# Patient Record
Sex: Male | Born: 1985 | Race: White | Hispanic: No | Marital: Single | Smoking: Current every day smoker
Health system: Southern US, Community
[De-identification: ages and names within clinical notes are randomized; demographics above are authoritative.]

---
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
## 2007-02-20 ENCOUNTER — Emergency Department (HOSPITAL_COMMUNITY): Admission: EM | Admit: 2007-02-20 | Discharge: 2007-02-20 | Payer: Self-pay | Admitting: Emergency Medicine

## 2007-11-13 ENCOUNTER — Emergency Department (HOSPITAL_COMMUNITY): Admission: EM | Admit: 2007-11-13 | Discharge: 2007-11-13 | Payer: Self-pay | Admitting: Emergency Medicine

## 2008-12-26 ENCOUNTER — Emergency Department (HOSPITAL_COMMUNITY): Admission: EM | Admit: 2008-12-26 | Discharge: 2008-12-26 | Payer: Self-pay | Admitting: *Deleted

## 2010-11-02 LAB — POCT CARDIAC MARKERS
CKMB, poc: <1 ng/mL — ABNORMAL LOW (ref 1.0–8.0)
Myoglobin, poc: 44 ng/mL (ref 12–200)
Myoglobin, poc: 58.1 ng/mL (ref 12–200)

## 2010-11-02 LAB — POCT I-STAT, CHEM 8
Creatinine, Ser: 1.1 mg/dL (ref 0.4–1.5)
Hemoglobin: 15 g/dL (ref 13.0–17.0)
Potassium: 3.6 mEq/L (ref 3.5–5.1)
Sodium: 142 mEq/L (ref 135–145)

## 2011-02-15 ENCOUNTER — Emergency Department (HOSPITAL_COMMUNITY): Payer: Self-pay

## 2011-02-15 ENCOUNTER — Emergency Department (HOSPITAL_COMMUNITY)
Admission: EM | Admit: 2011-02-15 | Discharge: 2011-02-15 | Disposition: A | Discharge status: Home / Self Care | Payer: Self-pay | Attending: Emergency Medicine | Admitting: Emergency Medicine

## 2011-02-15 DIAGNOSIS — M549 Dorsalgia, unspecified: Secondary | ICD-10-CM | POA: Insufficient documentation

## 2011-02-15 DIAGNOSIS — T07XXXA Unspecified multiple injuries, initial encounter: Secondary | ICD-10-CM | POA: Insufficient documentation

## 2011-02-15 DIAGNOSIS — S01119A Laceration without foreign body of unspecified eyelid and periocular area, initial encounter: Secondary | ICD-10-CM | POA: Insufficient documentation

## 2011-02-15 DIAGNOSIS — Y92009 Unspecified place in unspecified non-institutional (private) residence as the place of occurrence of the external cause: Secondary | ICD-10-CM | POA: Insufficient documentation

## 2011-02-15 DIAGNOSIS — R079 Chest pain, unspecified: Secondary | ICD-10-CM | POA: Insufficient documentation

## 2011-02-15 DIAGNOSIS — F17200 Nicotine dependence, unspecified, uncomplicated: Secondary | ICD-10-CM | POA: Insufficient documentation

## 2011-02-15 DIAGNOSIS — M79609 Pain in unspecified limb: Secondary | ICD-10-CM | POA: Insufficient documentation

## 2011-05-10 LAB — COMPREHENSIVE METABOLIC PANEL
ALT: 27
AST: 20
Albumin: 4.4
Calcium: 9.6
Creatinine, Ser: 1.03
GFR calc Af Amer: >60
GFR calc non Af Amer: >60
Sodium: 140
Total Protein: 7.7

## 2011-05-10 LAB — CBC
MCHC: 35
MCV: 85
Platelets: 320
RBC: 5.2
RDW: 13.4

## 2011-05-10 LAB — RAPID URINE DRUG SCREEN, HOSP PERFORMED
Amphetamines: NOT DETECTED
Barbiturates: NOT DETECTED
Benzodiazepines: NOT DETECTED
Cocaine: NOT DETECTED
Opiates: NOT DETECTED
Tetrahydrocannabinol: NOT DETECTED

## 2011-05-10 LAB — DIFFERENTIAL
Eosinophils Absolute: 0.1
Eosinophils Relative: 1
Lymphocytes Relative: 26
Lymphs Abs: 2.5
Monocytes Relative: 10

## 2012-05-17 ENCOUNTER — Encounter (HOSPITAL_COMMUNITY): Payer: Self-pay | Admitting: Physical Medicine and Rehabilitation

## 2012-05-17 ENCOUNTER — Emergency Department (HOSPITAL_COMMUNITY)
Admission: EM | Admit: 2012-05-17 | Discharge: 2012-05-17 | Disposition: A | Discharge status: Home / Self Care | Payer: Self-pay | Attending: Emergency Medicine | Admitting: Emergency Medicine

## 2012-05-17 DIAGNOSIS — K089 Disorder of teeth and supporting structures, unspecified: Secondary | ICD-10-CM | POA: Insufficient documentation

## 2012-05-17 DIAGNOSIS — Z79899 Other long term (current) drug therapy: Secondary | ICD-10-CM | POA: Insufficient documentation

## 2012-05-17 DIAGNOSIS — K0889 Other specified disorders of teeth and supporting structures: Secondary | ICD-10-CM

## 2012-05-17 DIAGNOSIS — F17200 Nicotine dependence, unspecified, uncomplicated: Secondary | ICD-10-CM | POA: Insufficient documentation

## 2012-05-17 MED ORDER — NAPROXEN 500 MG PO TABS: 500.0000 mg | ORAL_TABLET | Freq: Two times a day (BID) | ORAL | Status: AC | PRN

## 2012-05-17 MED ORDER — IBUPROFEN 400 MG PO TABS
600.0000 mg | ORAL_TABLET | Freq: Once | ORAL | Status: AC
  Administered 2012-05-17: 600 mg via ORAL
  Filled 2012-05-17: qty 1

## 2012-05-17 MED ORDER — PENICILLIN V POTASSIUM 500 MG PO TABS: 500.0000 mg | ORAL_TABLET | Freq: Four times a day (QID) | ORAL | Status: AC

## 2012-05-17 MED ORDER — OXYCODONE-ACETAMINOPHEN 5-325 MG PO TABS: 1.0000 | ORAL_TABLET | ORAL | Status: AC | PRN

## 2012-05-17 NOTE — ED Notes (Signed)
Pt presents to department for evaluation of L lower molar pain. Ongoing x2 days. Pt states "this could be my wisdom tooth." 5/10 pain upon arrival to ED. He is conscious alert and oriented x4.

## 2012-05-17 NOTE — ED Notes (Signed)
Signature pad not working in room, pt verbalized understanding of discharge instructions.

## 2012-05-17 NOTE — ED Provider Notes (Signed)
History    26yM with toothache. L lower molar. Gradual onset about 2d ago. Constant. Worse when trying to eat or drink. No difficulty breathing or swallowing. No fever or chills. Has not seen dentist for this. Has tried tylenol and oragel with minimal relief.  CSN: [id number]  Arrival date & time 05/17/12  1538   First MD Initiated Contact with Patient 05/17/12 1627      Chief Complaint  Patient presents with  . Dental Pain    (Consider location/radiation/quality/duration/timing/severity/associated sxs/prior treatment) HPI  No past medical history on file.  No past surgical history on file.  History reviewed. No pertinent family history.  History  Substance Use Topics  . Smoking status: Current Every Day Smoker  . Smokeless tobacco: Not on file  . Alcohol Use: No      Review of Systems   Review of symptoms negative unless otherwise noted in HPI.   Allergies  Review of patient's allergies indicates no known allergies.  Home Medications   Current Outpatient Rx  Name Route Sig Dispense Refill  . ACETAMINOPHEN 325 MG PO TABS Oral Take 650 mg by mouth every 6 (six) hours as needed. For pain    . NAPROXEN 500 MG PO TABS Oral Take 1 tablet (500 mg total) by mouth 2 (two) times daily as needed. 20 tablet 0  . OXYCODONE-ACETAMINOPHEN 5-325 MG PO TABS Oral Take 1-2 tablets by mouth every 4 (four) hours as needed for pain. 15 tablet 0  . PENICILLIN V POTASSIUM 500 MG PO TABS Oral Take 1 tablet (500 mg total) by mouth 4 (four) times daily. 28 tablet 0    BP 122/92  Pulse 122  Temp 98.2 F (36.8 C) (Oral)  Resp 22  SpO2 96%  Physical Exam  Nursing note and vitals reviewed. Constitutional: He appears well-developed and well-nourished. No distress.  HENT:  Head: Normocephalic and atraumatic.       L lower 2nd molar cracked. No drainable collection. Posterior pharynx clear. Uvula midline. Handling secretions. Submental tissues soft. Neck supple. No nodes. No stridor.    Eyes: Conjunctivae normal are normal. Right eye exhibits no discharge. Left eye exhibits no discharge.  Neck: Neck supple.  Cardiovascular: Normal rate, regular rhythm and normal heart sounds.  Exam reveals no gallop and no friction rub.   No murmur heard. Pulmonary/Chest: Effort normal and breath sounds normal. No respiratory distress.  Abdominal: Soft. He exhibits no distension. There is no tenderness.  Musculoskeletal: He exhibits no edema and no tenderness.  Neurological: He is alert.  Skin: Skin is warm and dry.  Psychiatric: He has a normal mood and affect. His behavior is normal. Thought content normal.    ED Course  NERVE BLOCK Date/Time: 05/17/2012 4:40 PM Performed by: Raeford Razor Authorized by: Raeford Razor Consent: Verbal consent obtained. Risks and benefits: risks, benefits and alternatives were discussed Patient identity confirmed: verbally with patient, provided demographic data and arm band Indications: pain relief Body area: face/mouth Nerve: inferior alveolar Laterality: left Patient sedated: no Patient position: sitting Needle gauge: 27 G Location technique: anatomical landmarks Local anesthetic: lidocaine 2% with epinephrine Anesthetic total: 1.5 ml Outcome: pain improved Patient tolerance: Patient tolerated the procedure well with no immediate complications.   (including critical care time)  Labs Reviewed - No data to display No results found.   1. Pain, dental       MDM  26yM with dental pain. Cracked molar. No evidence of deep space head/neck infection. Not tachy on my exam  as noted in triage vitals. Possibly from pain/anxiety? Non-toxic. Dental block for pain relief. PRN pain meds. Dental resources provided. Return precautions dicussed.        Raeford Razor, MD 05/17/12 [phone]

## 2013-01-21 IMAGING — CT CT CERVICAL SPINE W/O CM
4 of 5 series · 15 of 33 positions shown, 17 images · non-contrast
Comparison: None.

CLINICAL DATA: Pain status post trauma.

CT CERVICAL SPINE WITHOUT CONTRAST,CT HEAD WITHOUT CONTRAST
TECHNIQUE: Multidetector CT imaging of the cervical spine was
performed. Multiplanar CT image reconstructions were also
generated.,Technique:  Contiguous axial images were obtained from
the base of the skull through the vertex without contrast.

[Series 6: c_spine 2.0 b31s detail · axial · 0.28mm/px · z∈[+965,+1091]mm · 4 of 106 slices shown, 5 images]
[im 22/106  soft-tissue]
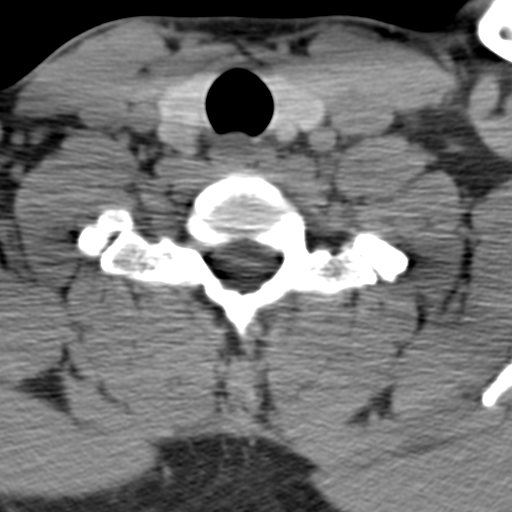
[im 22/106  bone]
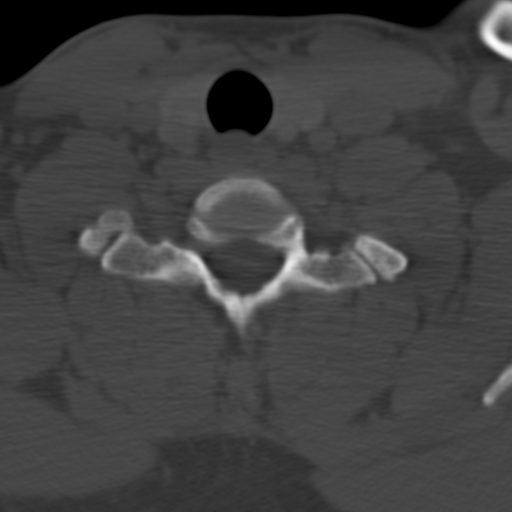
[im 43/106  bone]
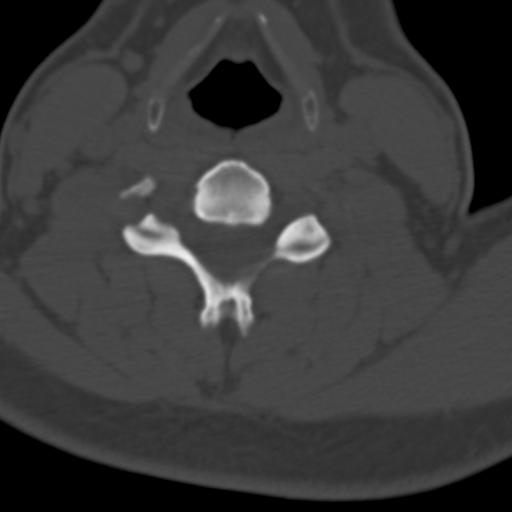
[im 64/106  bone]
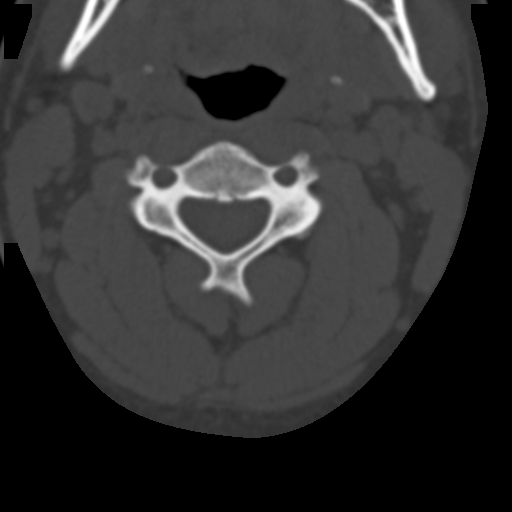
[im 85/106  bone]
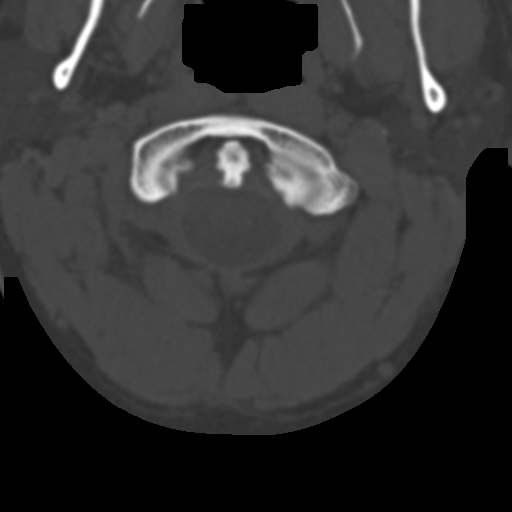

[Series 602: axial · axial · 0.41mm/px · z∈[+960,+1049]mm · 3 of 91 slices shown]
[im 23/91  bone]
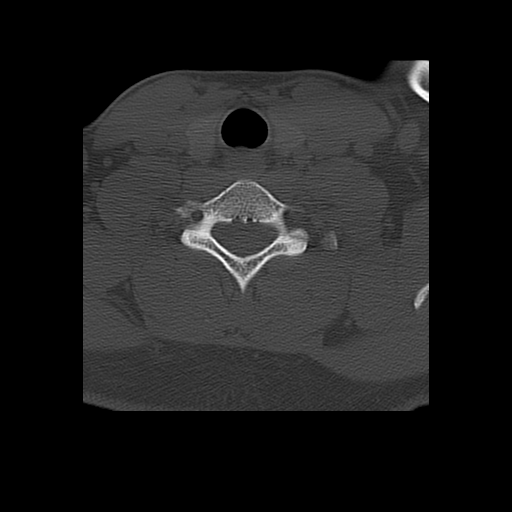
[im 46/91  bone]
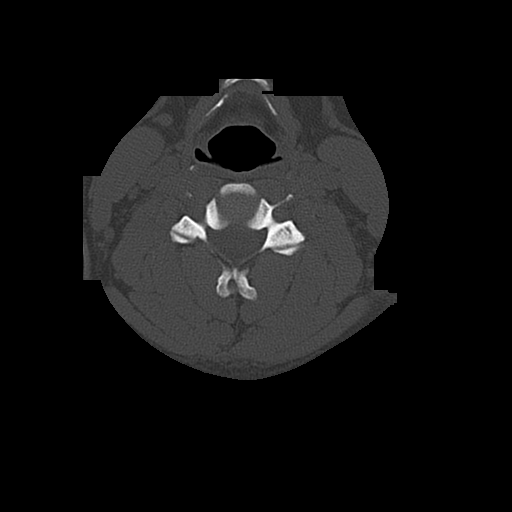
[im 68/91  bone]
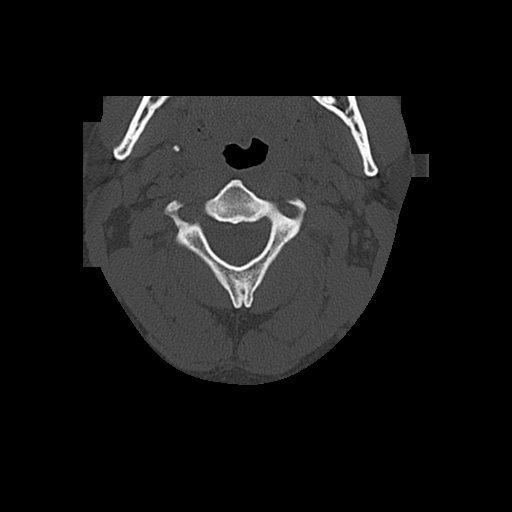

[Series 603: cor · coronal · 0.41mm/px · 3 of 55 slices shown]
[im 11/55  bone]
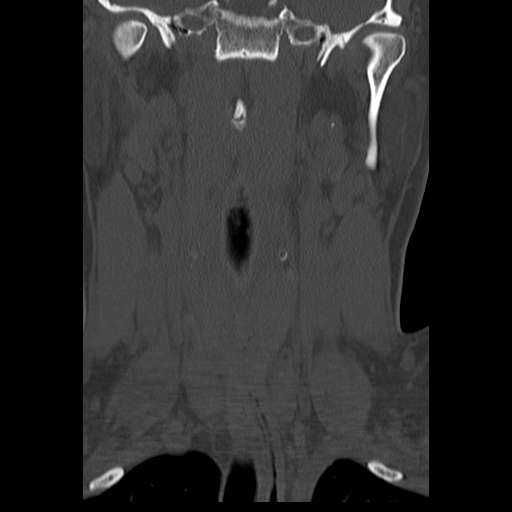
[im 22/55  bone]
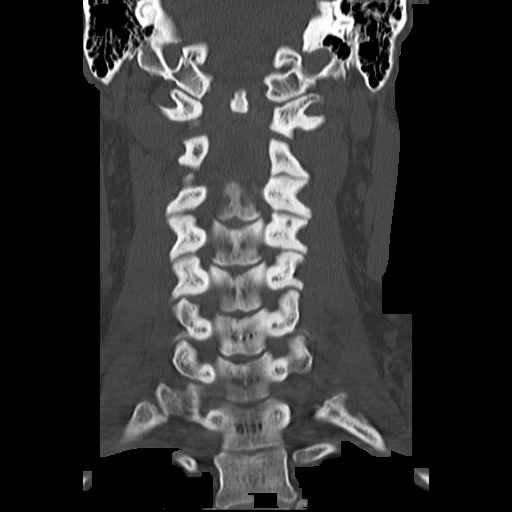
[im 33/55  bone]
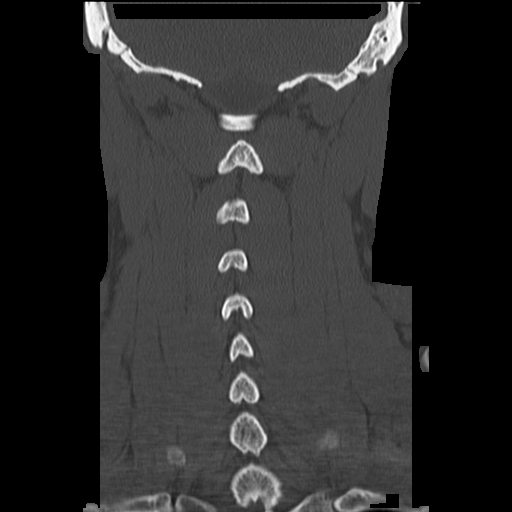

[Series 604: sag · sagittal · 0.41mm/px · 5 of 51 slices shown, 6 images]
[im 17/51  bone]
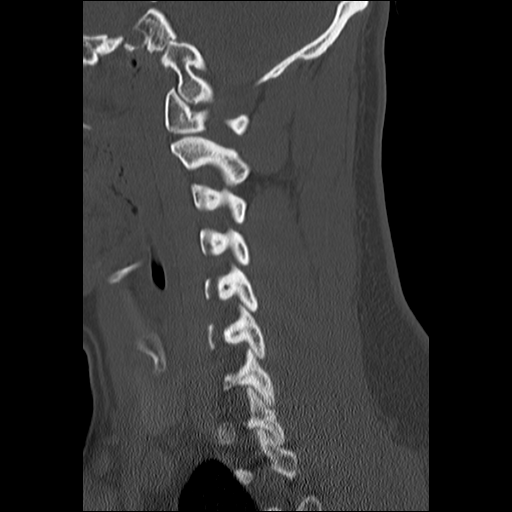
[im 21/51  bone]
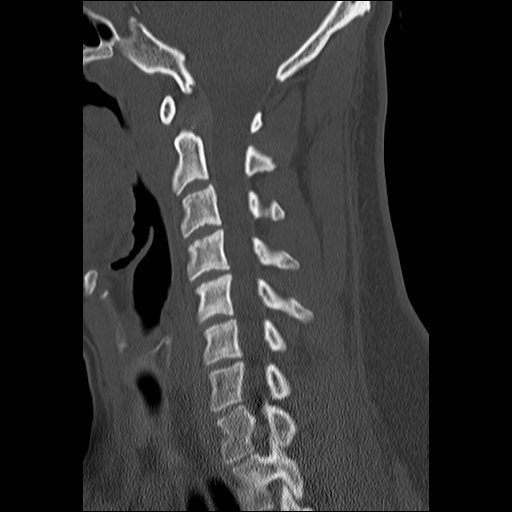
[im 26/51  soft-tissue]
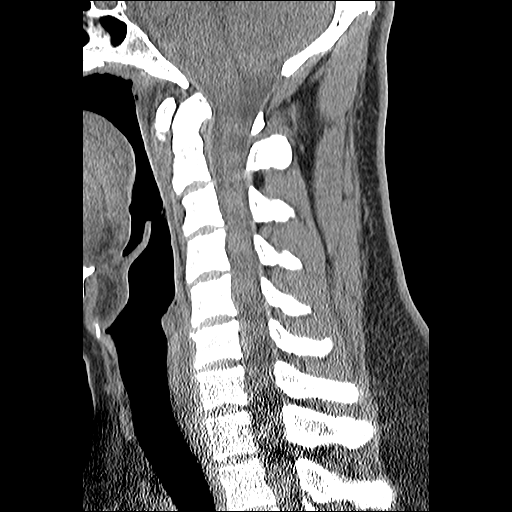
[im 26/51  bone]
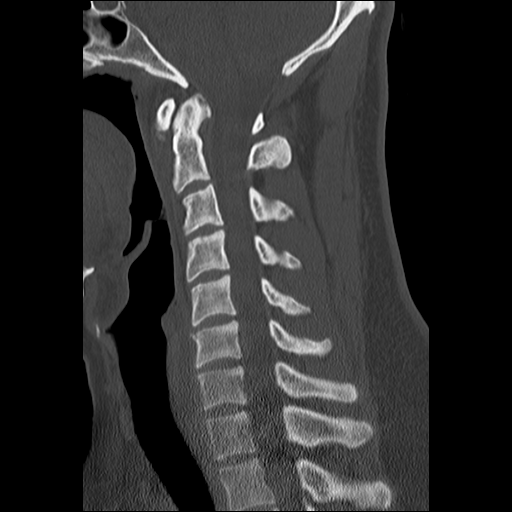
[im 30/51  bone]
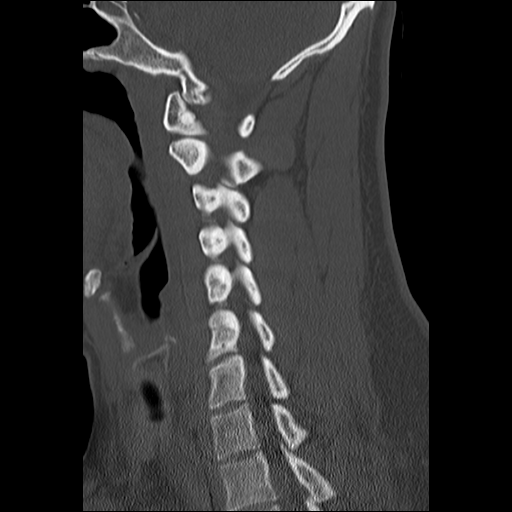
[im 34/51  bone]
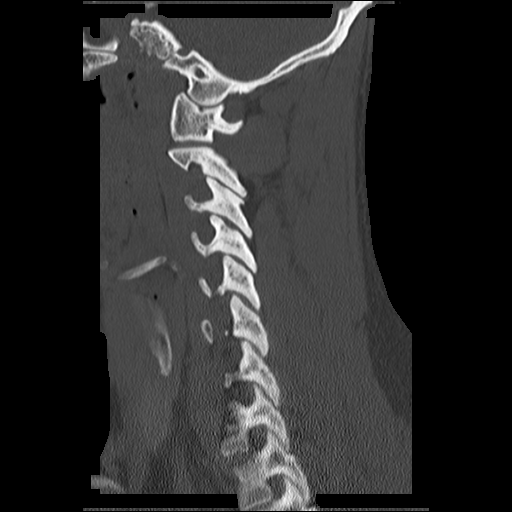

[15 of 33 positions shown; findings below may reference images not displayed]

FINDINGS: Brain:  There is no evidence for acute hemorrhage, hydrocephalus,
mass lesion, or abnormal extra-axial fluid collection.  No definite
CT evidence for acute infarction.  The visualized paranasal sinuses
and mastoid air cells are predominately clear.,

Cervical spine: Loss of normal cervical lordosis is likely
positional or secondary to muscle spasm.  Vertebral body heights
are maintained as are the intervertebral disc spaces.  No acute
fracture or dislocation identified.  There is a well corticated
osseous density along the inferior margin of the anterior arch of
C1.  The craniocervical relationship is maintained.

Prevertebral soft tissues and paravertebral soft tissues are within
normal limits.  The lung apices are clear.
IMPRESSION: No acute intracranial abnormality.

Loss of normal cervical lordosis is likely positional or secondary
to muscle spasm.  No acute fracture or dislocation identified.

## 2013-11-12 ENCOUNTER — Emergency Department (HOSPITAL_COMMUNITY)
Admission: EM | Admit: 2013-11-12 | Discharge: 2013-11-12 | Disposition: A | Discharge status: Home / Self Care | Payer: Self-pay | Attending: Emergency Medicine | Admitting: Emergency Medicine

## 2013-11-12 ENCOUNTER — Encounter (HOSPITAL_COMMUNITY): Payer: Self-pay | Admitting: Emergency Medicine

## 2013-11-12 DIAGNOSIS — H6120 Impacted cerumen, unspecified ear: Secondary | ICD-10-CM | POA: Insufficient documentation

## 2013-11-12 DIAGNOSIS — H6090 Unspecified otitis externa, unspecified ear: Secondary | ICD-10-CM

## 2013-11-12 DIAGNOSIS — H60399 Other infective otitis externa, unspecified ear: Secondary | ICD-10-CM | POA: Insufficient documentation

## 2013-11-12 DIAGNOSIS — F17200 Nicotine dependence, unspecified, uncomplicated: Secondary | ICD-10-CM | POA: Insufficient documentation

## 2013-11-12 MED ORDER — CARBAMIDE PEROXIDE 6.5 % OT SOLN: 5.0000 [drp] | Freq: Two times a day (BID) | OTIC | Status: AC | PRN

## 2013-11-12 MED ORDER — NEOMYCIN-POLYMYXIN-HC 3.5-10000-1 OT SUSP: 4.0000 [drp] | Freq: Four times a day (QID) | OTIC | Status: AC

## 2013-11-12 NOTE — ED Notes (Signed)
Pt cannot hear out of left ear since 7pm yesterday.  No pain

## 2013-11-12 NOTE — Discharge Instructions (Signed)
Read the information below.  Use the prescribed medication as directed.  Please discuss all new medications with your pharmacist.  You may return to the Emergency Department at any time for worsening condition or any new symptoms that concern you.  If you develop fevers, uncontrolled pain, discharge or excessive bleeding from your ear, return to the ER for a recheck.     Cerumen Impaction A cerumen impaction is when the wax in your ear forms a plug. This plug usually causes reduced hearing. Sometimes it also causes an earache or dizziness. Removing a cerumen impaction can be difficult and painful. The wax sticks to the ear canal. The canal is sensitive and bleeds easily. If you try to remove a heavy wax buildup with a cotton tipped swab, you may push it in further. Irrigation with water, suction, and small ear curettes may be used to clear out the wax. If the impaction is fixed to the skin in the ear canal, ear drops may be needed for a few days to loosen the wax. People who build up a lot of wax frequently can use ear wax removal products available in your local drugstore. SEEK MEDICAL CARE IF:  You develop an earache, increased hearing loss, or marked dizziness. Document Released: 08/19/2004 Document Revised: 10/04/2011 Document Reviewed: 10/09/2009 [facility] Patient Information 2014 [place], [place].  Otitis Externa Otitis externa is a bacterial or fungal infection of the outer ear canal. This is the area from the eardrum to the outside of the ear. Otitis externa is sometimes called "swimmer's ear." CAUSES  Possible causes of infection include:  Swimming in dirty water.  Moisture remaining in the ear after swimming or bathing.  Mild injury (trauma) to the ear.  Objects stuck in the ear (foreign body).  Cuts or scrapes (abrasions) on the outside of the ear. SYMPTOMS  The first symptom of infection is often itching in the ear canal. Later signs and symptoms may include swelling and redness  of the ear canal, ear pain, and yellowish-white fluid (pus) coming from the ear. The ear pain may be worse when pulling on the earlobe. DIAGNOSIS  Your caregiver will perform a physical exam. A sample of fluid may be taken from the ear and examined for bacteria or fungi. TREATMENT  Antibiotic ear drops are often given for 10 to 14 days. Treatment may also include pain medicine or corticosteroids to reduce itching and swelling. PREVENTION   Keep your ear dry. Use the corner of a towel to absorb water out of the ear canal after swimming or bathing.  Avoid scratching or putting objects inside your ear. This can damage the ear canal or remove the protective wax that lines the canal. This makes it easier for bacteria and fungi to grow.  Avoid swimming in lakes, polluted water, or poorly chlorinated pools.  You may use ear drops made of rubbing alcohol and vinegar after swimming. Combine equal parts of white vinegar and alcohol in a bottle. Put 3 or 4 drops into each ear after swimming. HOME CARE INSTRUCTIONS   Apply antibiotic ear drops to the ear canal as prescribed by your caregiver.  Only take over-the-counter or prescription medicines for pain, discomfort, or fever as directed by your caregiver.  If you have diabetes, follow any additional treatment instructions from your caregiver.  Keep all follow-up appointments as directed by your caregiver. SEEK MEDICAL CARE IF:   You have a fever.  Your ear is still red, swollen, painful, or draining pus after 3 days.  Your redness, swelling, or pain gets worse.  You have a severe headache.  You have redness, swelling, pain, or tenderness in the area behind your ear. MAKE SURE YOU:   Understand these instructions.  Will watch your condition.  Will get help right away if you are not doing well or get worse. Document Released: 07/12/2005 Document Revised: 10/04/2011 Document Reviewed: 07/29/2011 [facility] Patient Information 2014  [place], [place].   Emergency Department Resource Guide 1) Find a Doctor and Pay Out of Pocket Although you won't have to find out who is covered by your insurance plan, it is a good idea to ask around and get recommendations. You will then need to call the office and see if the doctor you have chosen will accept you as a new patient and what types of options they offer for patients who are self-pay. Some doctors offer discounts or will set up payment plans for their patients who do not have insurance, but you will need to ask so you aren't surprised when you get to your appointment.  2) Contact Your Local Health Department Not all health departments have doctors that can see patients for sick visits, but many do, so it is worth a call to see if yours does. If you don't know where your local health department is, you can check in your phone book. The CDC also has a tool to help you locate your state's health department, and many state websites also have listings of all of their local health departments.  3) Find a Walk-in Clinic If your illness is not likely to be very severe or complicated, you may want to try a walk in clinic. These are popping up all over the country in pharmacies, drugstores, and shopping centers. They're usually staffed by nurse practitioners or physician assistants that have been trained to treat common illnesses and complaints. They're usually fairly quick and inexpensive. However, if you have serious medical issues or chronic medical problems, these are probably not your best option.  No Primary Care Doctor: - Call Health Connect at  [phone] - they can help you locate a primary care doctor that  accepts your insurance, provides certain services, etc. - Physician Referral Service- [phone]  Chronic Pain Problems: Organization         Address  Phone   Notes  [facility]  [phone] Patients need to be referred by their primary care doctor.    Medication Assistance: Organization         Address  Phone   Notes  [facility] Medication [facility] [address] [place], [place] [address] [phone] --Must be a resident of [facility] -- Must have NO insurance coverage whatsoever (no Medicaid/ Medicare, etc.) -- The pt. MUST have a primary care doctor that directs their care regularly and follows them in the community   MedAssist  [phone]   [employer]  [phone]    Agencies that provide inexpensive medical care: Organization         Address  Phone   Notes  [facility]  [phone]   [facility]    [phone]   [facility] [address] [place], [place] [address] [phone]   [facility] [address] [place], [place] [phone]   Planned Parenthood    [phone]   [facility]    [phone]   [facility]  [address], [place] Phone:  [phone], Fax:  [phone] Hours of Operation:  9 am - 6 pm, M-F.  Also accepts Medicaid/Medicare and self-pay.  [facility]  [address], [place] Phone: [phone], Fax: [phone]) [phone]. Hours of Operation:  8:30 am - 5:30 pm, M-F.  Also accepts Medicaid and self-pay.  [facility] [place] [address], [place] Phone: [phone]) [phone]   [facility] [address], [place], [place] [phone], Ext. [phone] Mondays & Thursdays: 7-9 AM.  First 15 patients are seen on a first come, first serve basis.    Medicaid-accepting [facility] Providers:  Organization         Address  Phone   Notes  [facility] [address], [place] [phone]) [phone] Also accepts self-pay patients.  [facility] [address], [place]  [phone]   [facility] [address], [place] [phone]) [phone]   [facility] [address] [place] [address], [place] [phone](336) [phone]   Renaye RakersVeita Bland [address], [place]   [phone]) [phone] Only accepts [place] Access [place] patients after they have their name applied to their card.   Self-Pay (no insurance) in [place] [facility]:  Organization         Address  Phone   Notes  Sickle Cell Patients, [facility] [address], [place] [phone]) [phone]   [facility] [address], [place] [phone]   [facility]  [address],  [phone]) [phone]   [facility]/Dr. Osei-Bonsu  [address] [place] [address], [place] or [phone], [place] [phone] Phone number for both [place] and [place] locations is the same.  [facility] [address], [place] [phone]) [phone]   [facility] [address] [place] [address], [place] or [address] [phone]) [phone]   [facility] [address], [place] [phone](336) [phone], phone; [phone], fax Sees patients 1st and 3rd Saturday of every month.  Must not qualify for public or private insurance (i.e. Medicaid, Medicare, [place] Health Choice, Veterans' Benefits)  Household income should be no more than 200% of the poverty level The clinic cannot treat you if you are pregnant or think you are pregnant  Sexually transmitted diseases are not treated at the clinic.    Dental Care: Organization         Address  Phone  Notes  [facility] [address], [place] [phone]) [phone] Accepts children up to age 30 who are enrolled in [place] or [place] Health Choice; pregnant women with a Medicaid card; and children who have applied for Medicaid or [place] Health Choice, but were declined, whose parents can pay a reduced fee at time of service.    [facility] [place]  [address], [place] [phone]) [phone] Accepts children up to age 30 who are enrolled in [place] or [place] Health Choice; pregnant women with a Medicaid card; and children who have applied for Medicaid or [place] Health Choice, but were declined, whose parents can pay a reduced fee at time of service.  [facility]  [address], [place] [phone]) [phone] Patients are seen by appointment only. Walk-ins are not accepted. [facility] will see patients 30 years of age and older. Monday - Tuesday (8am-5pm) Most Wednesdays (8:30-5pm) $30 per visit, cash only  [facility]  [address], [facility] [phone]) [phone] Patients are seen by appointment only. Walk-ins are not accepted. [facility] will see patients 30 years of age and older. One Wednesday Evening (Monthly: Volunteer Based).  $30 per visit, cash only  [employer] [facility]  [phone] for adults; Children under age 514, call Graduate Pediatric Dentistry at [phone]) [phone]. Children aged 784-14, please call [phone] to request a pediatric application.  Dental services are provided in all areas of dental care including fillings, crowns and bridges, complete and partial dentures, implants, gum treatment, root canals, and extractions. Preventive care is also provided. Treatment is provided to both adults and children. Patients are selected via a lottery and there is often a waiting list.   [facility] [address], [place]  [phone] [email or web address]   [facility] [address], [place], [place] [phone], Ext. [phone] Second and Fourth Thursday of each month, opens at 6:30 AM; Clinic ends at 9 AM.  Patients are seen on a first-come first-served basis, and a limited number are seen during each clinic.   [facility]  [address], [place], [place] [phone])  [phone]   Eligibility Requirements You must have lived in [place], [place], or [place] counties for at least the last three months.   You cannot be eligible for state or federal sponsored National Cityhealthcare insurance, including CIGNAVeterans Administration, [place]icaid, or [employer]Medicare.   You generally cannot be eligible for healthcare insurance through your employer.    How to apply: Eligibility screenings are held every Tuesday and Wednesday afternoon from 1:00 pm until 4:00 pm. You do not need an appointment for the interview!  [facility] [address], [place], [place] [phone]   [facility]  [phone]   [facility]  [phone]   [facility]  [phone]    Behavioral Health Resources in the Community: Intensive Outpatient Programs Organization         Address  Phone  Notes  [facility] [address], [place], [place] [phone]   [facility] [address], [place], [place] [phone]   [facility] [address], [place], [place]  [phone]   [facility] [address],  [place], [place] [phone] or [phone]   Substance Abuse Resources Organization         Address  Phone  Notes  [facility]  [phone]   [facility]  [phone]   [facility]  [phone]   [facility]  [phone]   Residential & Outpatient Substance Abuse Program  [phone]   Psychological Services Organization         Address  Phone  Notes  [facility]  [phone]   [facility]  [phone]- [phone]   [facility] [address], [place] [phone] or [phone]    Mobile Crisis Teams Organization         Address  Phone  Notes  [facility], Mobile Crisis Care Unit  [phone]   [facility]  [address] [place], [place] [phone]   Doristine LocksSharon DeEsch  [address] [place] [phone]    Self-Help/Support Groups Organization         Address  Phone             Notes  [facility] [place] - variety of support groups  336- I7437963 Call for more information  Narcotics Anonymous (NA), [facility] [address], [employer] [place]  2 meetings at this location   Statistician         Address  Phone  Notes  [facility] [address],    [place]  [phone]   [facility]  [address], [place] [address], [place], [place] [phone]   [facility] [address] [place], [place] [phone] Admissions: 8am-3pm M-F  [facility] [address],    [place], [place] [phone]   [facility] [address] [place], [place], [place] [phone]   [facility] [address]  [place], [place] [phone]   [facility] - Intensive Outpatient [address], [place], [place] [phone]   [facility] [address]  [place], [place] [phone] or [phone]   [facility] ([facility]) [address], [place], [place] [phone] Accepts Medicaid  [facility] [address]  [place] [phone] Substance Abuse/Addiction Treatment   [facility] Organization         Address  Phone  Notes  [facility]  [phone]   Angie Fava, PhD [address] [place], [place]   [phone] or [phone]   [facility]   [address] [place], [place] [phone]   [facility] [address] [place], [place] [phone] Insurance/Medicaid/sponsorship through [facility] and Families [address]                                    [place], [place] [phone]  Therapy/tele-psych/case  [facility] [address], [place] [phone]    Dr. Lolly Mustache  [phone]   [facility] [place]  [employer] [facility]le [place] Dept. 1) [address] [place] 2) [address], [place] 3)  [address], [place] [phone]  [phone]   [facility] Child Abuse Hotline 6363736071 or [phone] (After Hours)

## 2013-11-12 NOTE — ED Provider Notes (Signed)
CSN: [id number]     Arrival date & time 11/12/13  1203 History   First MD Initiated Contact with Patient 11/12/13 1328     Chief Complaint  Patient presents with  . Ear Fullness     (Consider location/radiation/quality/duration/timing/severity/associated sxs/prior Treatment) HPI  Pt reports he hear clicking in his left ear with moving his jaw yesterday.  He then cleaned his ears with Qtips and peroxide and suddenly couldn't hear out of his left ear.  Reports sound is muffled in left ear.  It is normal on the right.  Denies any pain.  Denies discharge from the ear.  Denies fever, URI symptoms.   History reviewed. No pertinent past medical history. History reviewed. No pertinent past surgical history. No family history on file. History  Substance Use Topics  . Smoking status: Current Every Day Smoker  . Smokeless tobacco: Not on file  . Alcohol Use: No    Review of Systems  Constitutional: Negative for fever.  HENT: Negative for ear discharge, ear pain and sore throat.   Respiratory: Negative for cough and shortness of breath.   Cardiovascular: Negative for chest pain.  Gastrointestinal: Negative for nausea, vomiting, abdominal pain and diarrhea.  All other systems reviewed and are negative.     Allergies  Review of patient's allergies indicates no known allergies.  Home Medications   Prior to Admission medications   Medication Sig Start Date End Date Taking? Authorizing Provider  acetaminophen (TYLENOL) 325 MG tablet Take 650 mg by mouth every 6 (six) hours as needed. For pain    Historical Provider, MD  naproxen (NAPROSYN) 500 MG tablet Take 1 tablet (500 mg total) by mouth 2 (two) times daily as needed. 05/17/12   Raeford RazorStephen Kohut, MD  oxyCODONE-acetaminophen (PERCOCET/ROXICET) 5-325 MG per tablet Take 1-2 tablets by mouth every 4 (four) hours as needed for pain. 05/17/12   Raeford RazorStephen Kohut, MD   BP 134/93  Pulse 72  Temp(Src) 98.7 F (37.1 C) (Oral)  Resp 17  Wt 233 lb  (105.688 kg)  SpO2 99% Physical Exam  Nursing note and vitals reviewed. Constitutional: He appears well-developed and well-nourished. No distress.  HENT:  Head: Normocephalic and atraumatic.  Left cerumen impaction.  No pain with traction.  After cerumen removal canal noted to be erythematous.  TM clear, no bulging.    Right large piece of hard cerumen.  TM visible and normal.  No erythema, edema, tenderness of canal.    Neck: Neck supple.  Pulmonary/Chest: Effort normal.  Neurological: He is alert.  Skin: He is not diaphoretic.    ED Course  EAR CERUMEN REMOVAL Date/Time: 11/12/2013 2:43 PM Performed by: Trixie DredgeWEST, Terrie Grajales Authorized by: Trixie DredgeWEST, Devita Nies Consent: Verbal consent obtained. Consent given by: patient Patient identity confirmed: verbally with patient Local anesthetic: none Location details: left ear Procedure type: curette and irrigation Patient sedated: no Patient tolerance: Patient tolerated the procedure well with no immediate complications. Comments: Irrigation by nurse, currette used by me.  Large amount of cerumen removed.  Patient's hearing returned to baseline after procedure.    (including critical care time) Labs Review Labs Reviewed - No data to display  Imaging Review No results found.   EKG Interpretation None      MDM   Final diagnoses:  None    Pt with cerumen impaction of left ear.  Irrigated in ED by nurse. Canal noted to be erythematous after cerumen removed.  Patient reporting complete relief, hearing returned to normal.  Pt denied any pain.  No systemic symptoms.  D/C home with cortisporin otic for this ear and debrox to be used for cleaning PRN.  Pt advised not to use Qtips.  Discussed  findings, treatment, and follow up  with patient.  Pt given return precautions.  Pt verbalizes understanding and agrees with plan.        Trixie Dredgemily Caterin Tabares, PA-C 11/12/13 1445

## 2013-11-13 NOTE — ED Provider Notes (Signed)
Medical screening examination/treatment/procedure(s) were conducted as a shared visit with non-physician practitioner(s) and myself.  I personally evaluated the patient during the encounter.   EKG Interpretation None      Pt with left ear fullness. Cerumen impaction on exam, nurse irrigating.   Suzi RootsKevin E Janicia Monterrosa, MD 11/13/13 [phone]

## 2022-04-06 ENCOUNTER — Emergency Department (HOSPITAL_COMMUNITY)
Admission: EM | Admit: 2022-04-06 | Discharge: 2022-04-06 | Disposition: A | Discharge status: Home / Self Care | Payer: Self-pay | Attending: Emergency Medicine | Admitting: Emergency Medicine

## 2022-04-06 ENCOUNTER — Emergency Department (HOSPITAL_COMMUNITY): Payer: Self-pay

## 2022-04-06 ENCOUNTER — Encounter (HOSPITAL_COMMUNITY): Payer: Self-pay

## 2022-04-06 DIAGNOSIS — R220 Localized swelling, mass and lump, head: Secondary | ICD-10-CM

## 2022-04-06 DIAGNOSIS — K047 Periapical abscess without sinus: Secondary | ICD-10-CM | POA: Insufficient documentation

## 2022-04-06 LAB — I-STAT CREATININE, ED: Creatinine, Ser: 0.8 mg/dL (ref 0.61–1.24)

## 2022-04-06 MED ORDER — IOHEXOL 300 MG/ML  SOLN
75.0000 mL | Freq: Once | INTRAMUSCULAR | Status: AC | PRN
  Administered 2022-04-06: 75 mL via INTRAVENOUS

## 2022-04-06 MED ORDER — AMOXICILLIN-POT CLAVULANATE 875-125 MG PO TABS: 1.0000 | ORAL_TABLET | Freq: Two times a day (BID) | ORAL | 0 refills | Status: AC

## 2022-04-06 MED ORDER — KETOROLAC TROMETHAMINE 15 MG/ML IJ SOLN
15.0000 mg | Freq: Once | INTRAMUSCULAR | Status: AC
  Administered 2022-04-06: 15 mg via INTRAVENOUS
  Filled 2022-04-06: qty 1

## 2022-04-06 MED ORDER — AMOXICILLIN-POT CLAVULANATE 875-125 MG PO TABS
1.0000 | ORAL_TABLET | Freq: Once | ORAL | Status: AC
  Administered 2022-04-06: 1 via ORAL
  Filled 2022-04-06: qty 1

## 2022-04-06 MED ORDER — FAMOTIDINE 20 MG PO TABS
40.0000 mg | ORAL_TABLET | Freq: Once | ORAL | Status: AC
  Administered 2022-04-06: 40 mg via ORAL
  Filled 2022-04-06: qty 2

## 2022-04-06 MED ORDER — METHYLPREDNISOLONE SODIUM SUCC 125 MG IJ SOLR
125.0000 mg | Freq: Once | INTRAMUSCULAR | Status: AC
  Administered 2022-04-06: 125 mg via INTRAMUSCULAR
  Filled 2022-04-06: qty 2

## 2022-04-06 MED ORDER — AMOXICILLIN 500 MG PO CAPS: 500.0000 mg | ORAL_CAPSULE | Freq: Once | ORAL | Status: DC

## 2022-04-06 MED ORDER — AMOXICILLIN 500 MG PO CAPS: 500.0000 mg | ORAL_CAPSULE | Freq: Three times a day (TID) | ORAL | 0 refills | Status: DC

## 2022-04-06 NOTE — ED Provider Triage Note (Signed)
Emergency Medicine Provider Triage Evaluation Note  Douglas Howell , a 36 y.o. male  was evaluated in triage.  Pt complains of dental pain with significant facial swelling.  Patient states that he was having a tooth ache on his upper teeth yesterday, so he took some Tylenol and applied some Abesol before he went to bed.  When he woke up this morning.  He had significant pain and swelling to the upper lip with swelling extending up the left cheek just under the left eye.  There is some periorbital swelling.  He denies fevers, chills, shortness of breath, tongue swelling.  No previous similar reaction.  Review of Systems  Positive:  Negative:   Physical Exam  BP (!) 126/95 (BP Location: Left Arm)   Pulse 100   Temp 98.9 F (37.2 C) (Oral)   Resp 17   Ht 6' (1.829 m)   Wt 99.8 kg   SpO2 97%   BMI 29.84 kg/m  Gen:   Awake, no distress   Resp:  Normal effort  MSK:   Moves extremities without difficulty  Other:  Upper lip is diffusely swollen and tender overlying skin is erythematous extending up to the left periorbital region.  Swelling is tender and firm to touch.  Medical Decision Making  Medically screening exam initiated at 1:46 PM.  Appropriate orders placed.  Douglas Howell was informed that the remainder of the evaluation will be completed by another provider, this initial triage assessment does not replace that evaluation, and the importance of remaining in the ED until their evaluation is complete.  Concern for possible abscess versus angioedema.  Given degree of swelling with rapid onset, will order CT maxillofacial.  Will give steroid and Pepcid in triage   Janell Quiet, PA-C 04/06/22 1350

## 2022-04-06 NOTE — ED Notes (Signed)
Needs IV/Labs  

## 2022-04-06 NOTE — ED Notes (Signed)
Still waiting for Labs/IV

## 2022-04-06 NOTE — ED Triage Notes (Signed)
Pt reports that he has been having dental pain since this weekend. Last night he took tylenol and Anbesol to get relief. Pt woke up this morning with significant upper jaw swelling L>R. Pt with poor dentition throughout.

## 2022-04-06 NOTE — Discharge Instructions (Addendum)
No drainable abscess at this time but swelling is from bad teeth and infection.  You were started on augmentin for the infection.  If symptoms worsen return to the ER.  Call the dentist for follow up.  Continue to take the pain reliever you have.

## 2022-04-06 NOTE — ED Provider Notes (Signed)
[name]-EMERGENCY DEPT Provider Note   CSN: [id number] Arrival date & time: 04/06/22  1237     History  Chief Complaint  Patient presents with   Facial Swelling   Dental Pain    Douglas Howell is a 36 y.o. male.  Patient is a 36 year old healthy male presenting today with dental pain and facial swelling.  He has multiple dental caries and reports that 3 days ago he started getting pain in the left upper lateral incisor and cuspid.  It has been a deep dull aching pain that is worse if his teeth touched or if he eats something that is hot but this morning he woke up and the pain was more intense and he noticed his upper lip and cheek were swollen.  He has no difficulty swallowing or voice changes.  He did have chills last night but did not take his temperature.  He had initiated evaluation with a dentist and was in the process of getting to a further clinic for dental care but had been waiting because his hours at work had decreased and it was difficult for him to afford.  The history is provided by the patient.  Dental Pain Location:  Upper Upper teeth location:  10/LU lateral incisor and 11/LU cuspid      Home Medications Prior to Admission medications   Medication Sig Start Date End Date Taking? Authorizing Provider  amoxicillin-clavulanate (AUGMENTIN) 875-125 MG tablet Take 1 tablet by mouth every 12 (twelve) hours. 04/06/22  Yes Gwyneth Sprout, MD  acetaminophen (TYLENOL) 325 MG tablet Take 650 mg by mouth every 6 (six) hours as needed. For pain    [provider]  carbamide peroxide (DEBROX) 6.5 % otic solution Place 5 drops into both ears 2 (two) times daily as needed (excess ear wax). 11/12/13   Trixie Dredge, PA-C  naproxen (NAPROSYN) 500 MG tablet Take 1 tablet (500 mg total) by mouth 2 (two) times daily as needed. 05/17/12   Raeford Razor, MD  neomycin-polymyxin-hydrocortisone (CORTISPORIN) 3.5-10000-1 otic suspension Place 4 drops into the  left ear 4 (four) times daily. X 7 days 11/12/13   Trixie Dredge, PA-C  oxyCODONE-acetaminophen (PERCOCET/ROXICET) 5-325 MG per tablet Take 1-2 tablets by mouth every 4 (four) hours as needed for pain. 05/17/12   Raeford Razor, MD      Allergies    Patient has no known allergies.    Review of Systems   Review of Systems  Physical Exam Updated Vital Signs BP 124/85   Pulse 80   Temp 98.9 F (37.2 C) (Oral)   Resp 17   Ht 6' (1.829 m)   Wt 99.8 kg   SpO2 99%   BMI 29.84 kg/m  Physical Exam Vitals and nursing note reviewed.  Constitutional:      General: He is not in acute distress. HENT:     Head: Normocephalic.     Mouth/Throat:     Mouth: Mucous membranes are moist.     Comments: Numerous dental caries affecting almost every tooth in the upper jaw.  Tenderness with palpation over the left upper lateral incisor and cuspid.  No fluctuance or induration noted of the gum but tenderness with palpation.  The left upper lip and cheek are swollen without erythema or abscesses present Eyes:     Pupils: Pupils are equal, round, and reactive to light.  Neck:     Comments: No trismus Cardiovascular:     Rate and Rhythm: Normal rate.  Pulmonary:  Effort: Pulmonary effort is normal. No respiratory distress.     Breath sounds: No stridor.  Musculoskeletal:     Cervical back: Normal range of motion and neck supple. No tenderness.  Neurological:     Mental Status: He is alert. Mental status is at baseline.  Psychiatric:        Mood and Affect: Mood normal.     ED Results / Procedures / Treatments   Labs (all labs ordered are listed, but only abnormal results are displayed) Labs Reviewed  I-STAT CREATININE, ED    EKG None  Radiology CT Maxillofacial W Contrast  Result Date: 04/06/2022 CLINICAL DATA:  Nasal abscess suspected. Dental abscess versus angio edema. EXAM: CT MAXILLOFACIAL WITH CONTRAST TECHNIQUE: Multidetector CT imaging of the maxillofacial structures was  performed with intravenous contrast. Multiplanar CT image reconstructions were also generated. RADIATION DOSE REDUCTION: This exam was performed according to the departmental dose-optimization program which includes automated exposure control, adjustment of the mA and/or kV according to patient size and/or use of iterative reconstruction technique. CONTRAST:  31mL OMNIPAQUE IOHEXOL 300 MG/ML  SOLN COMPARISON:  None FINDINGS: Osseous: Advanced and widespread dental and periodontal disease. Caries affecting virtually all teeth. Advanced lucency around the roots of the posterior mandibular dentition on both sides and the anterior maxillary dentition worse on the left. Orbits: Normal Sinuses: Mild mucosal thickening of the maxillary sinus floors left more than right. Soft tissues: Nonspecific soft tissue swelling of the lips and nose. No evidence of a drainable low-density collection. Limited intracranial: Normal IMPRESSION: Advanced dental and periodontal disease, particularly of the posterior mandibular dentition and anterior maxillary dentition. Nonspecific soft tissue swelling of the lips and nose most consistent with regional cellulitis. No evidence of a drainable abscess. Electronically Signed   By: Paulina Fusi M.D.   On: 04/06/2022 18:54    Procedures Procedures    Medications Ordered in ED Medications  amoxicillin-clavulanate (AUGMENTIN) 875-125 MG per tablet 1 tablet (has no administration in time range)  ketorolac (TORADOL) 15 MG/ML injection 15 mg (has no administration in time range)  methylPREDNISolone sodium succinate (SOLU-MEDROL) 125 mg/2 mL injection 125 mg (125 mg Intramuscular Given 04/06/22 1351)  famotidine (PEPCID) tablet 40 mg (40 mg Oral Given 04/06/22 1350)  iohexol (OMNIPAQUE) 300 MG/ML solution 75 mL (75 mLs Intravenous Contrast Given 04/06/22 1838)    ED Course/ Medical Decision Making/ A&P                           Medical Decision Making Risk Prescription drug  management.   Patient presenting today with multiple dental caries and concern for developing dental abscess with facial swelling.  Patient has no external signs of cellulitis such as erythema, warmth or tenderness with palpating the cheek or lip however does have tenderness with palpation of the anterior maxillary teeth and concern for developing dental infection.  I have independently visualized and interpreted pt's images today.  CT of the face does not show signs of abscess today.  Radiology reports advanced dental and periodontal disease particularly of the posterior mandibular dentition and anterior maxillary dentition with some nonspecific soft tissue swelling of the lips and nose without evidence of drainable abscess. Patient given Augmentin.  To follow-up with dentist.  He was given resources but had initiated a follow-up prior to developing the symptoms.  He will continue to take the over-the-counter pain medication he has been using at home.        Final  Clinical Impression(s) / ED Diagnoses Final diagnoses:  Facial swelling  Dental infection    Rx / DC Orders ED Discharge Orders          Ordered    amoxicillin (AMOXIL) 500 MG capsule  3 times daily,   Status:  Discontinued        04/06/22 1956    amoxicillin-clavulanate (AUGMENTIN) 875-125 MG tablet  Every 12 hours        04/06/22 1959              Gwyneth Sprout, MD 04/06/22 2013
